# Patient Record
Sex: Female | Born: 2000
Health system: Southern US, Community
[De-identification: ages and names within clinical notes are randomized; demographics above are authoritative.]

---
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
## 2019-05-30 ENCOUNTER — Ambulatory Visit: Payer: Medicaid Other | Attending: Internal Medicine

## 2019-05-30 ENCOUNTER — Other Ambulatory Visit: Payer: Self-pay

## 2019-05-30 DIAGNOSIS — Z20822 Contact with and (suspected) exposure to covid-19: Secondary | ICD-10-CM

## 2019-06-01 LAB — NOVEL CORONAVIRUS, NAA: SARS-CoV-2, NAA: NOT DETECTED
